# Patient Record
Sex: Female | Born: 1985 | Race: Black or African American | Hispanic: No | Marital: Single | Smoking: Current some day smoker
Health system: Southern US, Community
[De-identification: ages and names within clinical notes are randomized; demographics above are authoritative.]

## PROBLEM LIST (undated history)

## (undated) DIAGNOSIS — E785 Hyperlipidemia, unspecified: Secondary | ICD-10-CM

## (undated) HISTORY — PX: FOOT FRACTURE SURGERY: SHX645

## (undated) HISTORY — DX: Hyperlipidemia, unspecified: E78.5

---

## 2018-03-06 ENCOUNTER — Encounter (HOSPITAL_COMMUNITY): Payer: Self-pay | Admitting: Emergency Medicine

## 2018-03-06 ENCOUNTER — Emergency Department (HOSPITAL_COMMUNITY)
Admission: EM | Admit: 2018-03-06 | Discharge: 2018-03-07 | Disposition: A | Discharge status: Home / Self Care | Payer: 59 | Attending: Emergency Medicine | Admitting: Emergency Medicine

## 2018-03-06 ENCOUNTER — Emergency Department (HOSPITAL_COMMUNITY): Payer: 59

## 2018-03-06 ENCOUNTER — Other Ambulatory Visit: Payer: Self-pay

## 2018-03-06 DIAGNOSIS — S00212A Abrasion of left eyelid and periocular area, initial encounter: Secondary | ICD-10-CM | POA: Diagnosis not present

## 2018-03-06 DIAGNOSIS — Y999 Unspecified external cause status: Secondary | ICD-10-CM | POA: Diagnosis not present

## 2018-03-06 DIAGNOSIS — Z23 Encounter for immunization: Secondary | ICD-10-CM | POA: Diagnosis not present

## 2018-03-06 DIAGNOSIS — S80211A Abrasion, right knee, initial encounter: Secondary | ICD-10-CM | POA: Diagnosis not present

## 2018-03-06 DIAGNOSIS — Y929 Unspecified place or not applicable: Secondary | ICD-10-CM | POA: Diagnosis not present

## 2018-03-06 DIAGNOSIS — M542 Cervicalgia: Secondary | ICD-10-CM | POA: Diagnosis present

## 2018-03-06 DIAGNOSIS — M2550 Pain in unspecified joint: Secondary | ICD-10-CM

## 2018-03-06 DIAGNOSIS — Y939 Activity, unspecified: Secondary | ICD-10-CM | POA: Insufficient documentation

## 2018-03-06 LAB — POC URINE PREG, ED: Preg Test, Ur: NEGATIVE

## 2018-03-06 MED ORDER — ACETAMINOPHEN 500 MG PO TABS: 500.0000 mg | ORAL_TABLET | Freq: Four times a day (QID) | ORAL | 0 refills | Status: DC | PRN

## 2018-03-06 MED ORDER — ACETAMINOPHEN 500 MG PO TABS
1000.0000 mg | ORAL_TABLET | Freq: Once | ORAL | Status: AC
  Administered 2018-03-06: 1000 mg via ORAL
  Filled 2018-03-06: qty 2

## 2018-03-06 MED ORDER — CYCLOBENZAPRINE HCL 10 MG PO TABS: 10.0000 mg | ORAL_TABLET | Freq: Two times a day (BID) | ORAL | 0 refills | Status: DC | PRN

## 2018-03-06 MED ORDER — CYCLOBENZAPRINE HCL 10 MG PO TABS
10.0000 mg | ORAL_TABLET | Freq: Once | ORAL | Status: AC
  Administered 2018-03-06: 10 mg via ORAL
  Filled 2018-03-06: qty 1

## 2018-03-06 MED ORDER — TETANUS-DIPHTH-ACELL PERTUSSIS 5-2.5-18.5 LF-MCG/0.5 IM SUSP
0.5000 mL | Freq: Once | INTRAMUSCULAR | Status: AC
  Administered 2018-03-06: 0.5 mL via INTRAMUSCULAR
  Filled 2018-03-06: qty 0.5

## 2018-03-06 MED ORDER — IBUPROFEN 600 MG PO TABS: 600.0000 mg | ORAL_TABLET | Freq: Four times a day (QID) | ORAL | 0 refills | Status: DC | PRN
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
## 2018-03-06 NOTE — Discharge Instructions (Signed)
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
## 2018-03-06 NOTE — Progress Notes (Signed)
Orthopedic Tech Progress Note Patient Details:  Samantha Haney [birth year] [id number] Pt was given sleeve to go home to put on because she has a pants on before I got there. Ortho Devices Type of Ortho Device: Crutches, Knee Sleeve Ortho Device/Splint Location: Rt knee Ortho Device/Splint Interventions: Adjustment   Post Interventions Patient Tolerated: Well Instructions Provided: Adjustment of device, Care of device   Tawni Carnes [facility] 03/06/2018, 11:45 PM
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
## 2018-03-07 NOTE — ED Notes (Signed)
Patient verbalizes understanding of discharge instructions. Opportunity for questioning and answers were provided. Armband removed by staff, pt discharged from ED home via POV with family. 

## 2018-03-13 ENCOUNTER — Ambulatory Visit (INDEPENDENT_AMBULATORY_CARE_PROVIDER_SITE_OTHER): Payer: 59 | Admitting: Sports Medicine

## 2018-03-13 VITALS — BP 104/76 | Ht 66.0 in | Wt 130.0 lb

## 2018-03-13 DIAGNOSIS — M7582 Other shoulder lesions, left shoulder: Secondary | ICD-10-CM

## 2018-03-13 DIAGNOSIS — S8001XA Contusion of right knee, initial encounter: Secondary | ICD-10-CM

## 2018-03-13 MED ORDER — METHYLPREDNISOLONE ACETATE 40 MG/ML IJ SUSP
40.0000 mg | Freq: Once | INTRAMUSCULAR | Status: AC
  Administered 2018-03-13: 40 mg via INTRA_ARTICULAR

## 2018-03-13 MED ORDER — MELOXICAM 15 MG PO TABS: 15.0000 mg | ORAL_TABLET | Freq: Every day | ORAL | 1 refills | Status: DC

## 2018-03-13 NOTE — Patient Instructions (Signed)
Thank you for coming see [place] today in clinic.  You were seen today for your left shoulder and right knee.  We gave you a shot in the shoulder, which should hopefully help with the pain.  For your knee, we will give you an anti-inflammatory medication.  We will see back in 2 weeks to reevaluate.
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
## 2018-03-27 ENCOUNTER — Ambulatory Visit (INDEPENDENT_AMBULATORY_CARE_PROVIDER_SITE_OTHER): Payer: 59 | Admitting: Sports Medicine

## 2018-03-27 ENCOUNTER — Encounter: Payer: Self-pay | Admitting: Sports Medicine

## 2018-03-27 VITALS — BP 139/97 | Ht 66.0 in | Wt 180.0 lb

## 2018-03-27 DIAGNOSIS — S8002XD Contusion of left knee, subsequent encounter: Secondary | ICD-10-CM | POA: Diagnosis not present

## 2018-03-27 MED ORDER — TRAMADOL HCL 50 MG PO TABS: 50.0000 mg | ORAL_TABLET | Freq: Four times a day (QID) | ORAL | 0 refills | Status: DC | PRN
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
## 2018-03-27 NOTE — Progress Notes (Signed)
Subjective       Objective             Assessment/Plan

## 2018-04-10 ENCOUNTER — Ambulatory Visit (INDEPENDENT_AMBULATORY_CARE_PROVIDER_SITE_OTHER): Payer: 59 | Admitting: Sports Medicine

## 2018-04-10 VITALS — BP 118/84 | Ht 66.0 in | Wt 180.0 lb

## 2018-04-10 DIAGNOSIS — S8001XD Contusion of right knee, subsequent encounter: Secondary | ICD-10-CM
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
## 2018-04-24 ENCOUNTER — Ambulatory Visit (INDEPENDENT_AMBULATORY_CARE_PROVIDER_SITE_OTHER): Payer: 59 | Admitting: Sports Medicine

## 2018-04-24 VITALS — BP 112/88 | Ht 66.0 in | Wt 180.0 lb

## 2018-04-24 DIAGNOSIS — S8001XD Contusion of right knee, subsequent encounter: Secondary | ICD-10-CM
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
## 2018-04-26 ENCOUNTER — Other Ambulatory Visit: Payer: Self-pay | Admitting: Gastroenterology

## 2018-04-26 DIAGNOSIS — R112 Nausea with vomiting, unspecified: Secondary | ICD-10-CM

## 2018-04-26 DIAGNOSIS — R1013 Epigastric pain: Secondary | ICD-10-CM

## 2018-05-02 ENCOUNTER — Telehealth: Payer: Self-pay | Admitting: *Deleted

## 2018-05-02 NOTE — Telephone Encounter (Signed)
Letter written for patient to pick up.

## 2018-05-04 ENCOUNTER — Ambulatory Visit
Admission: RE | Admit: 2018-05-04 | Discharge: 2018-05-04 | Disposition: A | Discharge status: Home / Self Care | Payer: 59 | Source: Ambulatory Visit | Attending: Sports Medicine | Admitting: Sports Medicine

## 2018-05-04 DIAGNOSIS — S8001XD Contusion of right knee, subsequent encounter: Secondary | ICD-10-CM

## 2018-05-06 ENCOUNTER — Ambulatory Visit
Admission: RE | Admit: 2018-05-06 | Discharge: 2018-05-06 | Disposition: A | Discharge status: Home / Self Care | Payer: 59 | Source: Ambulatory Visit | Attending: Gastroenterology | Admitting: Gastroenterology

## 2018-05-06 DIAGNOSIS — R1013 Epigastric pain: Secondary | ICD-10-CM

## 2018-05-06 DIAGNOSIS — R112 Nausea with vomiting, unspecified: Secondary | ICD-10-CM

## 2018-05-07 ENCOUNTER — Telehealth: Payer: Self-pay

## 2018-05-07 NOTE — Telephone Encounter (Signed)
Note written and faxed to pt's work.

## 2018-05-14 ENCOUNTER — Encounter (HOSPITAL_COMMUNITY): Payer: Self-pay | Admitting: Psychiatry

## 2018-05-14 ENCOUNTER — Ambulatory Visit (INDEPENDENT_AMBULATORY_CARE_PROVIDER_SITE_OTHER): Payer: 59 | Admitting: Psychiatry

## 2018-05-14 ENCOUNTER — Other Ambulatory Visit: Payer: Self-pay

## 2018-05-14 VITALS — BP 138/80 | HR 65 | Temp 98.3°F | Ht 66.0 in | Wt 175.0 lb

## 2018-05-14 DIAGNOSIS — F411 Generalized anxiety disorder: Secondary | ICD-10-CM | POA: Diagnosis not present

## 2018-05-14 DIAGNOSIS — F4310 Post-traumatic stress disorder, unspecified: Secondary | ICD-10-CM

## 2018-05-14 DIAGNOSIS — M25569 Pain in unspecified knee: Secondary | ICD-10-CM | POA: Insufficient documentation

## 2018-05-14 MED ORDER — TRAZODONE HCL 50 MG PO TABS: ORAL_TABLET | ORAL | 0 refills | Status: DC

## 2018-05-14 MED ORDER — SERTRALINE HCL 50 MG PO TABS: ORAL_TABLET | ORAL | 1 refills | Status: DC
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
## 2018-05-15 ENCOUNTER — Ambulatory Visit (INDEPENDENT_AMBULATORY_CARE_PROVIDER_SITE_OTHER): Payer: 59 | Admitting: Sports Medicine

## 2018-05-15 ENCOUNTER — Other Ambulatory Visit: Payer: Self-pay

## 2018-05-15 VITALS — BP 112/82 | Temp 98.3°F | Ht 66.0 in | Wt 173.0 lb

## 2018-05-15 DIAGNOSIS — S8001XD Contusion of right knee, subsequent encounter: Secondary | ICD-10-CM | POA: Diagnosis not present

## 2018-05-15 DIAGNOSIS — S46012D Strain of muscle(s) and tendon(s) of the rotator cuff of left shoulder, subsequent encounter: Secondary | ICD-10-CM

## 2018-05-15 DIAGNOSIS — S8011XD Contusion of right lower leg, subsequent encounter: Secondary | ICD-10-CM

## 2018-05-15 MED ORDER — MELOXICAM 15 MG PO TABS: ORAL_TABLET | ORAL | 0 refills | Status: DC
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
## 2018-05-22 ENCOUNTER — Ambulatory Visit: Payer: 59 | Admitting: Sports Medicine

## 2018-06-11 ENCOUNTER — Encounter: Payer: Self-pay | Admitting: Family Medicine

## 2018-06-11 ENCOUNTER — Other Ambulatory Visit: Payer: Self-pay

## 2018-06-11 ENCOUNTER — Ambulatory Visit (INDEPENDENT_AMBULATORY_CARE_PROVIDER_SITE_OTHER): Payer: 59 | Admitting: Family Medicine

## 2018-06-11 VITALS — BP 118/81 | HR 60 | Temp 98.3°F | Ht 66.0 in | Wt 180.0 lb

## 2018-06-11 DIAGNOSIS — M67912 Unspecified disorder of synovium and tendon, left shoulder: Secondary | ICD-10-CM

## 2018-06-11 NOTE — Patient Instructions (Signed)
We repeated the injection for your shoulder today. Wait 5-7 days then restart home exercises - these are very important to prevent this from returning. Meloxicam 15mg  daily with food for pain and inflammation. Icing as needed. Follow up with [place] in 6 weeks for reevaluation. Will consider repeating your ultrasound, physical therapy if not improving as expected.
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
## 2018-06-12 ENCOUNTER — Encounter: Payer: Self-pay | Admitting: Family Medicine

## 2018-06-12 DIAGNOSIS — M67912 Unspecified disorder of synovium and tendon, left shoulder: Secondary | ICD-10-CM | POA: Diagnosis not present

## 2018-06-12 MED ORDER — METHYLPREDNISOLONE ACETATE 40 MG/ML IJ SUSP
40.0000 mg | Freq: Once | INTRAMUSCULAR | Status: AC
  Administered 2018-06-12: 40 mg

## 2018-06-14 ENCOUNTER — Ambulatory Visit (INDEPENDENT_AMBULATORY_CARE_PROVIDER_SITE_OTHER): Payer: 59 | Admitting: Psychiatry

## 2018-06-14 ENCOUNTER — Other Ambulatory Visit: Payer: Self-pay

## 2018-06-14 DIAGNOSIS — Z79899 Other long term (current) drug therapy: Secondary | ICD-10-CM

## 2018-06-14 DIAGNOSIS — F4310 Post-traumatic stress disorder, unspecified: Secondary | ICD-10-CM

## 2018-06-14 DIAGNOSIS — F411 Generalized anxiety disorder: Secondary | ICD-10-CM | POA: Diagnosis not present

## 2018-06-14 MED ORDER — SERTRALINE HCL 50 MG PO TABS: 75.0000 mg | ORAL_TABLET | Freq: Every day | ORAL | 1 refills | Status: DC
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
## 2018-06-18 ENCOUNTER — Ambulatory Visit: Payer: Managed Care, Other (non HMO) | Admitting: Family Medicine

## 2018-07-23 ENCOUNTER — Ambulatory Visit: Payer: Managed Care, Other (non HMO) | Admitting: Family Medicine

## 2018-07-30 ENCOUNTER — Ambulatory Visit: Payer: Managed Care, Other (non HMO) | Admitting: Family Medicine

## 2018-08-07 ENCOUNTER — Other Ambulatory Visit: Payer: Self-pay

## 2018-08-07 ENCOUNTER — Ambulatory Visit (INDEPENDENT_AMBULATORY_CARE_PROVIDER_SITE_OTHER): Payer: 59 | Admitting: Family Medicine

## 2018-08-07 VITALS — BP 110/80 | Ht 66.0 in | Wt 175.0 lb

## 2018-08-07 DIAGNOSIS — M25512 Pain in left shoulder: Secondary | ICD-10-CM

## 2018-08-07 DIAGNOSIS — M545 Low back pain: Secondary | ICD-10-CM

## 2018-08-07 DIAGNOSIS — M5459 Other low back pain: Secondary | ICD-10-CM

## 2018-08-07 NOTE — Patient Instructions (Signed)
We will go ahead with an MRI of your left shoulder since you're not improving as expected.  You have a lumbar strain. Ok to take tylenol for baseline pain relief (1-2 extra strength tabs 3x/day) Aleve or ibuprofen with food for pain and inflammation as needed. Stay as active as possible. Do home exercises and stretches as directed - hold each for 20-30 seconds and do each one three times. Consider massage, chiropractor, physical therapy, and/or acupuncture. Physical therapy has been shown to be helpful while the others have mixed results - start this. Strengthening of low back muscles, abdominal musculature are key for long term pain relief. If not improving, will consider further imaging (MRI). Follow up with me as needed.

## 2018-08-13 ENCOUNTER — Encounter: Payer: Self-pay | Admitting: Family Medicine
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
## 2018-08-14 ENCOUNTER — Ambulatory Visit (INDEPENDENT_AMBULATORY_CARE_PROVIDER_SITE_OTHER): Payer: 59 | Admitting: Psychiatry

## 2018-08-14 ENCOUNTER — Other Ambulatory Visit: Payer: Self-pay

## 2018-08-14 ENCOUNTER — Encounter (HOSPITAL_COMMUNITY): Payer: Self-pay | Admitting: Psychiatry

## 2018-08-14 DIAGNOSIS — F1010 Alcohol abuse, uncomplicated: Secondary | ICD-10-CM

## 2018-08-14 DIAGNOSIS — F4310 Post-traumatic stress disorder, unspecified: Secondary | ICD-10-CM

## 2018-08-14 DIAGNOSIS — F411 Generalized anxiety disorder: Secondary | ICD-10-CM

## 2018-08-14 MED ORDER — SERTRALINE HCL 100 MG PO TABS: 100.0000 mg | ORAL_TABLET | Freq: Every day | ORAL | 1 refills | Status: DC
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
## 2018-08-20 ENCOUNTER — Ambulatory Visit: Payer: 59 | Admitting: Physical Therapy

## 2018-08-26 ENCOUNTER — Ambulatory Visit: Payer: 59 | Attending: Family Medicine | Admitting: Physical Therapy

## 2018-09-04 ENCOUNTER — Telehealth: Payer: Self-pay | Admitting: General Practice

## 2018-09-04 NOTE — Telephone Encounter (Signed)
Patient is calling to find out where she needs to go for her MRI of L. Shoulder.  Please let her know, thanks.

## 2018-09-09 ENCOUNTER — Other Ambulatory Visit: Payer: Self-pay

## 2018-09-11 ENCOUNTER — Other Ambulatory Visit: Payer: Self-pay

## 2018-09-11 ENCOUNTER — Encounter: Payer: Self-pay | Admitting: Physical Therapy

## 2018-09-11 ENCOUNTER — Ambulatory Visit: Payer: 59 | Attending: Family Medicine | Admitting: Physical Therapy

## 2018-09-11 DIAGNOSIS — G8929 Other chronic pain: Secondary | ICD-10-CM | POA: Diagnosis present

## 2018-09-11 DIAGNOSIS — M545 Low back pain: Secondary | ICD-10-CM | POA: Diagnosis present
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
## 2018-09-19 ENCOUNTER — Ambulatory Visit: Payer: 59 | Admitting: Physical Therapy

## 2018-09-20 ENCOUNTER — Encounter

## 2018-09-24 ENCOUNTER — Ambulatory Visit: Payer: 59 | Admitting: Physical Therapy

## 2018-09-24 ENCOUNTER — Other Ambulatory Visit: Payer: Self-pay

## 2018-09-24 ENCOUNTER — Encounter: Payer: Self-pay | Admitting: Physical Therapy

## 2018-09-24 DIAGNOSIS — M545 Low back pain, unspecified: Secondary | ICD-10-CM

## 2018-09-24 DIAGNOSIS — G8929 Other chronic pain: Secondary | ICD-10-CM
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
## 2018-09-25 ENCOUNTER — Ambulatory Visit (HOSPITAL_COMMUNITY): Payer: 59 | Admitting: Psychiatry

## 2018-09-26 ENCOUNTER — Ambulatory Visit: Payer: 59 | Admitting: Physical Therapy

## 2018-09-26 ENCOUNTER — Encounter: Payer: Self-pay | Admitting: Physical Therapy

## 2018-09-26 ENCOUNTER — Other Ambulatory Visit: Payer: Self-pay

## 2018-09-26 DIAGNOSIS — M545 Low back pain: Secondary | ICD-10-CM | POA: Diagnosis not present

## 2018-09-26 DIAGNOSIS — G8929 Other chronic pain: Secondary | ICD-10-CM

## 2018-09-26 NOTE — Therapy (Signed)
[place], [place], [address] Phone: [phone]   Fax:  [phone]  Physical Therapy Treatment  Patient Details  Name: Samantha Haney MRN: [id number] Date of Birth: 11-22-1985 Referring Provider (PT): Dene Gentry, MD   Encounter Date: 09/26/2018  PT End of Session - 09/26/18 1615    Visit Number  3    Number of Visits  13    Date for PT Re-Evaluation  10/25/18    Authorization Type  UHC/CIGNA    PT Start Time  4536    PT Stop Time  1638    PT Time Calculation (min)  23 min    Activity Tolerance  Patient tolerated treatment well    Behavior During Therapy  [facility] for tasks assessed/performed       History reviewed. No pertinent past medical history.  Past Surgical History:  Procedure Laterality Date  . FOOT FRACTURE SURGERY Bilateral     There were no vitals filed for this visit.  Subjective Assessment - 09/26/18 1616    Subjective  It has been a long day. My back is just sore- put icy hot on it which helped a lot. Feels like it is more on the right side. I go off of work at 5:30AM and have been in the car all day.    Patient Stated Goals  bend over, put pressure on Rt leg. used to be able to bend to touch floor, stoop, play with kids                       [facility] Adult PT Treatment/Exercise - 09/26/18 0001      Knee/Hip Exercises: Stretches   Passive Hamstring Stretch  Both;2 reps;30 seconds    Piriformis Stretch Limitations  figure 4    Other Knee/Hip Stretches  sidelying QL stretch      Knee/Hip Exercises: Aerobic   Nustep  UE & LE 3 min L5      Knee/Hip Exercises: Supine   Bridges  15 reps    Other Supine Knee/Hip Exercises  hip abduction leg hovered over the table      Knee/Hip Exercises: Sidelying   Clams  x20 each      Manual Therapy   Manual Therapy  Soft tissue mobilization    Soft tissue mobilization  Rt QL               PT Short Term Goals -  09/11/18 1548      PT SHORT TERM GOAL #1   Title  STG=LTG        PT Long Term Goals - 09/11/18 1548      PT LONG TERM GOAL #1   Title  gross LE strength 5/5    Baseline  gross 4/5 at eval with discomfort upon testing    Time  6    Period  Weeks    Status  New    Target Date  10/25/18      PT LONG TERM GOAL #2   Title  Pt will be able to tolerate sitting at computer for 45 min comfortably for work and be compliant with a regular movement routine    Baseline  unable to sit comfortably at eval    Time  6    Period  Weeks    Status  New    Target Date  10/25/18      PT LONG TERM GOAL #3   Title  Pt will be able to bend and stoop to play with kids, LBP <=3/10    Baseline  severe pain at eval, avoids a lot of these positions due to pain    Time  6    Period  Weeks    Status  New    Target Date  10/25/18      PT LONG TERM GOAL #4   Title  Pt will be able to perform lifting activities required for work with minimal to no pain    Baseline  severe at eval    Time  6    Period  Weeks    Status  New    Target Date  10/25/18            Plan - 09/26/18 1638    Clinical Impression Statement  Pt was exhausted after working last night and being up all day driving back and forth to [place] and had her Mom and daughter in the car so we shortened her visit today. Added hip abduction strengthening to HEP. Improved ability to engage core.    PT Treatment/Interventions  ADLs/Self Care Home Management;Cryotherapy;Electrical Stimulation;Ultrasound;Traction;Moist Heat;Iontophoresis 53m/ml Dexamethasone;Functional mobility training;Therapeutic activities;Therapeutic exercise;Neuromuscular re-education;Manual techniques;Patient/family education;Passive range of motion;Dry needling;Taping    PT Next Visit Plan  review HEP    PT Home Exercise Plan  TrA engagement, MET PRN, iso LE adduction, LTR, bicycle, hamstring stretch, piriformis stretch; clam, supine hip abd leg hovering    Consulted and  Agree with Plan of Care  Patient       Patient will benefit from skilled therapeutic intervention in order to improve the following deficits and impairments:  Decreased range of motion, Increased muscle spasms, Decreased activity tolerance, Pain, Improper body mechanics, Impaired flexibility, Decreased strength, Postural dysfunction  Visit Diagnosis: 1. Chronic bilateral low back pain without sciatica        Problem List Patient Active Problem List   Diagnosis Date Noted  . Knee pain 05/14/2018    Kareemah C. Claretta Kendra PT, DPT 09/26/18 4:40 PM   [facility]GP[address] [place] [address]Phone: [phone]  Fax:  [phone] Name: JShandria ClinchMRN: [id number] of Birth: 425-Sep-1987

## 2018-10-01 ENCOUNTER — Ambulatory Visit: Payer: 59 | Attending: Family Medicine | Admitting: Physical Therapy

## 2018-10-01 ENCOUNTER — Encounter: Payer: Self-pay | Admitting: Physical Therapy

## 2018-10-01 ENCOUNTER — Other Ambulatory Visit: Payer: Self-pay

## 2018-10-01 DIAGNOSIS — G8929 Other chronic pain: Secondary | ICD-10-CM | POA: Insufficient documentation

## 2018-10-01 DIAGNOSIS — M545 Low back pain, unspecified: Secondary | ICD-10-CM

## 2018-10-01 NOTE — Therapy (Signed)
[place], [place], [address] Phone: [phone]   Fax:  [phone]  Physical Therapy Treatment/Discharge  Patient Details  Name: Samantha Haney MRN: [id number] Date of Birth: 1986/01/09 Referring Provider (PT): Dene Gentry, MD   Encounter Date: 10/01/2018  PT End of Session - 10/01/18 1509    Visit Number  4    Number of Visits  13    Date for PT Re-Evaluation  10/25/18    Authorization Type  UHC/CIGNA    PT Start Time  6203    PT Stop Time  1518    PT Time Calculation (min)  12 min    Activity Tolerance  Patient tolerated treatment well    Behavior During Therapy  [facility] for tasks assessed/performed       History reviewed. No pertinent past medical history.  Past Surgical History:  Procedure Laterality Date  . FOOT FRACTURE SURGERY Bilateral     There were no vitals filed for this visit.  Subjective Assessment - 10/01/18 1509    Subjective  started a boxing class so I am sore.    Patient Stated Goals  bend over, put pressure on Rt leg. used to be able to bend to touch floor, stoop, play with kids    Currently in Pain?  No/denies         [facility] PT Assessment - 10/01/18 0001      Assessment   Medical Diagnosis  mechanical LBP    Referring Provider (PT)  Barbaraann Barthel, Sharyn Lull, MD      Strength   Overall Strength Comments  gross 5/5 no pain                           PT Education - 10/01/18 1535    Education Details  importance of continued exercise & HEP    Person(s) Educated  Patient    Methods  Explanation    Comprehension  Verbalized understanding       PT Short Term Goals - 09/11/18 1548      PT SHORT TERM GOAL #1   Title  STG=LTG        PT Long Term Goals - 10/01/18 1512      PT LONG TERM GOAL #1   Title  gross LE strength 5/5    Status  Achieved      PT LONG TERM GOAL #2   Title  Pt will be able to tolerate sitting at computer for 45 min  comfortably for work and be compliant with a regular movement routine    Status  Achieved      PT LONG TERM GOAL #3   Title  Pt will be able to bend and stoop to play with kids, LBP <=3/10    Status  Achieved      PT LONG TERM GOAL #4   Title  Pt will be able to perform lifting activities required for work with minimal to no pain    Status  Achieved            Plan - 10/01/18 1527    Clinical Impression Statement  Pt has met all of her goals at this time and feels really good. She has started a boxing class for exercise and reports understanding and use of her HEP. encouraged her to contact us with any further questions.    PT Treatment/Interventions  ADLs/Self Care Home Management;Cryotherapy;Electrical Stimulation;Ultrasound;Traction;Moist Heat;Iontophoresis 51m/ml Dexamethasone;Functional  mobility training;Therapeutic activities;Therapeutic exercise;Neuromuscular re-education;Manual techniques;Patient/family education;Passive range of motion;Dry needling;Taping    PT Home Exercise Plan  TrA engagement, MET PRN, iso LE adduction, LTR, bicycle, hamstring stretch, piriformis stretch; clam, supine hip abd leg hovering    Consulted and Agree with Plan of Care  Patient       Patient will benefit from skilled therapeutic intervention in order to improve the following deficits and impairments:  Decreased range of motion, Increased muscle spasms, Decreased activity tolerance, Pain, Improper body mechanics, Impaired flexibility, Decreased strength, Postural dysfunction  Visit Diagnosis: 1. Chronic bilateral low back pain without sciatica        Problem List Patient Active Problem List   Diagnosis Date Noted  . Knee pain 05/14/2018   PHYSICAL THERAPY DISCHARGE SUMMARY  Visits from Start of Care: 4   Current functional level related to goals / functional outcomes: See above   Remaining deficits: See above   Education / Equipment: Anatomy of condition, POC, HEP, exercise  form/rationale  Plan: Patient agrees to discharge.  Patient goals were met. Patient is being discharged due to meeting the stated rehab goals.  ?????     Janavia C. Alyssha Housh PT, DPT 10/01/18 3:36 PM   [facility] [address] [place], [place], [address] Phone: [phone]   Fax:  [phone]  Name: Samantha Haney MRN: [id number] Date of Birth: October 27, 1985

## 2018-10-03 ENCOUNTER — Ambulatory Visit: Payer: 59 | Admitting: Physical Therapy

## 2018-10-08 ENCOUNTER — Ambulatory Visit: Payer: 59 | Admitting: Physical Therapy

## 2018-10-10 ENCOUNTER — Encounter: Payer: Self-pay | Admitting: Physical Therapy

## 2018-10-15 ENCOUNTER — Encounter: Payer: Self-pay | Admitting: Physical Therapy

## 2018-10-17 ENCOUNTER — Encounter: Payer: Self-pay | Admitting: Physical Therapy

## 2018-10-22 ENCOUNTER — Encounter: Payer: Self-pay | Admitting: Physical Therapy

## 2018-10-23 ENCOUNTER — Telehealth (HOSPITAL_COMMUNITY): Payer: Self-pay

## 2018-10-23 NOTE — Telephone Encounter (Signed)
error 

## 2018-10-24 ENCOUNTER — Encounter: Payer: Self-pay | Admitting: Physical Therapy

## 2018-12-23 ENCOUNTER — Ambulatory Visit (INDEPENDENT_AMBULATORY_CARE_PROVIDER_SITE_OTHER): Payer: 59 | Admitting: Family Medicine

## 2018-12-23 ENCOUNTER — Other Ambulatory Visit: Payer: Self-pay

## 2018-12-23 VITALS — BP 110/84 | Ht 66.0 in | Wt 180.0 lb

## 2018-12-23 DIAGNOSIS — S8011XD Contusion of right lower leg, subsequent encounter: Secondary | ICD-10-CM

## 2018-12-23 DIAGNOSIS — S8001XD Contusion of right knee, subsequent encounter: Secondary | ICD-10-CM | POA: Diagnosis not present

## 2018-12-23 DIAGNOSIS — S8011XA Contusion of right lower leg, initial encounter: Secondary | ICD-10-CM | POA: Insufficient documentation

## 2018-12-23 DIAGNOSIS — M545 Low back pain: Secondary | ICD-10-CM | POA: Insufficient documentation

## 2018-12-23 DIAGNOSIS — M5459 Other low back pain: Secondary | ICD-10-CM

## 2018-12-23 DIAGNOSIS — S8001XA Contusion of right knee, initial encounter: Secondary | ICD-10-CM | POA: Insufficient documentation

## 2018-12-23 MED ORDER — DICLOFENAC SODIUM 75 MG PO TBEC: 75.0000 mg | DELAYED_RELEASE_TABLET | Freq: Two times a day (BID) | ORAL | 1 refills | Status: AC

## 2018-12-23 NOTE — Assessment & Plan Note (Signed)
Knee exam remains within normal limits aside from tenderness palpation of anteriomedial portion of knee.  MRI of knee from March 2020, again reviewed.  Swelling seen on MRI corresponds to location of pain.  She did not have improvement with physical therapy per her report.  Structurally, knee exam is normal.  Advised patient there is nothing else to offer for this as there is no indication for surgery.  She declines physical therapy.

## 2018-12-23 NOTE — Patient Instructions (Signed)
We will go ahead with an MRI of your lumbar spine given your pain has persisted in your low back. Take diclofenac 75mg  twice a day with food for pain and inflammation. Ok to take tylenol, use topical medication with this. Ice (or heat if this feels better) 15 minutes at a time 3-4 times a day. Follow up with me after the MRI for a no charge visit to go over results and next steps.
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
## 2018-12-24 ENCOUNTER — Encounter: Payer: Self-pay | Admitting: Family Medicine

## 2018-12-26 ENCOUNTER — Ambulatory Visit: Payer: Managed Care, Other (non HMO) | Admitting: Cardiology

## 2018-12-26 DIAGNOSIS — E785 Hyperlipidemia, unspecified: Secondary | ICD-10-CM | POA: Insufficient documentation

## 2018-12-26 NOTE — Progress Notes (Deleted)
Patient referred by Servando Salina, MD for dyslipidemia  Subjective:   Samantha Haney, female    DOB: 27-Aug-1985, 32 y.o.   MRN: [id number]  *** No chief complaint on file.   *** HPI  33 y.o. *** female referred for evaluation and management of dyslipidemia.  Patient recently underwent lipid panel through Dr. Harvie Bridge office that showed triglycerides elevated at 268, LDL at 114.***  No past medical history on file.  *** Past Surgical History:  Procedure Laterality Date  . FOOT FRACTURE SURGERY Bilateral     *** Social History   Socioeconomic History  . Marital status: Single    Spouse name: Not on file  . Number of children: Not on file  . Years of education: Not on file  . Highest education level: Not on file  Occupational History  . Not on file  Social Needs  . Financial resource strain: Not on file  . Food insecurity    Worry: Not on file    Inability: Not on file  . Transportation needs    Medical: Not on file    Non-medical: Not on file  Tobacco Use  . Smoking status: Current Some Day Smoker    Types: Cigars  . Smokeless tobacco: Never Used  . Tobacco comment: black and milds  Substance and Sexual Activity  . Alcohol use: Yes    Comment: at night to help sleep  . Drug use: Never  . Sexual activity: Yes    Birth control/protection: Condom  Lifestyle  . Physical activity    Days per week: Not on file    Minutes per session: Not on file  . Stress: Not on file  Relationships  . Social Herbalist on phone: Not on file    Gets together: Not on file    Attends religious service: Not on file    Active member of club or organization: Not on file    Attends meetings of clubs or organizations: Not on file    Relationship status: Not on file  . Intimate partner violence    Fear of current or ex partner: Not on file    Emotionally abused: Not on file    Physically abused: Not on file    Forced sexual activity: Not on file  Other  Topics Concern  . Not on file  Social History Narrative  . Not on file    *** No family history on file.  *** Current Outpatient Medications on File Prior to Visit  Medication Sig Dispense Refill  . diclofenac (VOLTAREN) 75 MG EC tablet Take 1 tablet (75 mg total) by mouth 2 (two) times daily. 60 tablet 1  . meloxicam (MOBIC) 15 MG tablet Take 1 tablet daily with food for 7 days. Then take as needed. (Patient not taking: Reported on 09/11/2018) 40 tablet 0  . Multiple Vitamins-Minerals (EMERGEN-C IMMUNE PLUS/VIT D) CHEW Chew by mouth.    . sertraline (ZOLOFT) 100 MG tablet Take 1 tablet (100 mg total) by mouth daily. (Patient not taking: Reported on 09/11/2018) 30 tablet 1  . traMADol (ULTRAM) 50 MG tablet Take by mouth every 6 (six) hours as needed.    . traZODone (DESYREL) 50 MG tablet Take 1/2 to one tab at bed time for insomnia (Patient not taking: Reported on 08/14/2018) 30 tablet 0   No current facility-administered medications on file prior to visit.     Cardiovascular studies:  ***  *** Recent labs: 11/22/2018: Glucose 81.  BUN/creatinine/0.77.  eGFR normal.  Sodium 141, potassium 4.4.  Rest of the CMP normal. H/H 12/36.  MCV 93.  Platelets 349. Cholesterol 250, triglycerides 268, HDL 55, LDL 114. Hemoglobin A1c 5.4%.  ***  ROS      *** There were no vitals filed for this visit.   There is no height or weight on file to calculate BMI. There were no vitals filed for this visit.  *** Objective:   Physical Exam        Assessment & Recommendations:   ***  ***   Thank you for referring the patient to [place]. Please feel free to contact with any questions.  Nigel Mormon, MD [facility] Pager: [phone] Office: [phone] If no answer Cell [phone]

## 2019-01-01 ENCOUNTER — Other Ambulatory Visit: Payer: Managed Care, Other (non HMO)

## 2019-01-08 NOTE — Progress Notes (Signed)
Patient referred by Servando Salina, MD for dyslipidemia  Subjective:   Samantha Haney, female    DOB: 1986-02-19, 33 y.o.   MRN: [id number]   Chief Complaint  Patient presents with   Hyperlipidemia   New Patient (Initial Visit)    HPI  33 y.o. African-American female referred for evaluation and management of dyslipidemia.  Patient recently underwent lipid panel through Dr. Harvie Bridge office that showed triglycerides elevated at 268, LDL at 114.  Patient is unclear about recent blood work today, but understands when to explain to her.  She is single mother, lives with her 2 children.  She works from home for [employer].  She used to exercise regularly until few months ago.  Recently, work has meant that she does not do any regular physical activity.  She denies any chest pain, shortness of breath.  She does not have a known family history of coronary artery disease or hyperlipidemia.  She smokes 1 cigar/day for last 20 years.  She is willing to quit.  She endorses snoring at night.  Blood pressure mildly elevated today.  Patient has 2 children and does not have any plans for future pregnancies at this time.  That said, she does not use any regular contraception, but is not sexually active.   Past Medical History:  Diagnosis Date   Hyperlipidemia      Past Surgical History:  Procedure Laterality Date   FOOT FRACTURE SURGERY Bilateral      Social History   Socioeconomic History   Marital status: Single    Spouse name: Not on file   Number of children: 2   Years of education: Not on file   Highest education level: Not on file  Occupational History   Not on file  Social Needs   Financial resource strain: Not on file   Food insecurity    Worry: Not on file    Inability: Not on file   Transportation needs    Medical: Not on file    Non-medical: Not on file  Tobacco Use   Smoking status: Current Some Day Smoker    Types: Cigars    Smokeless tobacco: Never Used   Tobacco comment: black and milds 1 per day  Substance and Sexual Activity   Alcohol use: Yes    Comment: at night to help sleep   Drug use: Never   Sexual activity: Yes    Birth control/protection: Condom  Lifestyle   Physical activity    Days per week: Not on file    Minutes per session: Not on file   Stress: Not on file  Relationships   Social connections    Talks on phone: Not on file    Gets together: Not on file    Attends religious service: Not on file    Active member of club or organization: Not on file    Attends meetings of clubs or organizations: Not on file    Relationship status: Not on file   Intimate partner violence    Fear of current or ex partner: Not on file    Emotionally abused: Not on file    Physically abused: Not on file    Forced sexual activity: Not on file  Other Topics Concern   Not on file  Social History Narrative   Not on file     History reviewed. No pertinent family history.   Current Outpatient Medications on File Prior to Visit  Medication Sig Dispense Refill  diclofenac (VOLTAREN) 75 MG EC tablet Take 1 tablet (75 mg total) by mouth 2 (two) times daily. 60 tablet 1   Multiple Vitamins-Minerals (EMERGEN-C IMMUNE PLUS/VIT D) CHEW Chew by mouth.     No current facility-administered medications on file prior to visit.     Cardiovascular studies:  EKG 01/09/2019: Sinus rhythm 77 bpm. Poor R wave progression. Nonspecific ST-T changes, inferior leads.    Recent labs: 11/22/2018: Glucose 81.  BUN/creatinine/0.77.  eGFR normal.  Sodium 141, potassium 4.4.  Rest of the CMP normal. H/H 12/36.  MCV 93.  Platelets 349. Cholesterol 250, triglycerides 268, HDL 55, LDL 114. Hemoglobin A1c 5.4%.   Review of Systems  Constitution: Negative for decreased appetite, malaise/fatigue, weight gain and weight loss.  HENT: Negative for congestion.   Eyes: Negative for visual disturbance.    Cardiovascular: Negative for chest pain, dyspnea on exertion, leg swelling, palpitations and syncope.  Respiratory: Positive for snoring. Negative for cough.   Endocrine: Negative for cold intolerance.  Hematologic/Lymphatic: Does not bruise/bleed easily.  Skin: Negative for itching and rash.  Musculoskeletal: Negative for myalgias.  Gastrointestinal: Negative for abdominal pain, nausea and vomiting.  Genitourinary: Negative for dysuria.  Neurological: Negative for dizziness and weakness.  Psychiatric/Behavioral: The patient is not nervous/anxious.   All other systems reviewed and are negative.        Vitals:   01/09/19 0941 01/09/19 1008  BP: (!) 158/104 136/87  Pulse: 74 68  SpO2: 98%      Body mass index is 31.93 kg/m. Filed Weights   01/09/19 0941  Weight: 197 lb 12.8 oz (89.7 kg)     Objective:   Physical Exam  Constitutional: She is oriented to person, place, and time. She appears well-developed and well-nourished. No distress.  HENT:  Head: Normocephalic and atraumatic.  Eyes: Pupils are equal, round, and reactive to light. Conjunctivae are normal.  Neck: No JVD present.  Cardiovascular: Normal rate, regular rhythm and intact distal pulses.  No murmur heard. Pulmonary/Chest: Effort normal and breath sounds normal. She has no wheezes. She has no rales.  Abdominal: Soft. Bowel sounds are normal. There is no rebound.  Musculoskeletal:        General: No edema.  Lymphadenopathy:    She has no cervical adenopathy.  Neurological: She is alert and oriented to person, place, and time. No cranial nerve deficit.  Skin: Skin is warm and dry.  Psychiatric: She has a normal mood and affect.  Nursing note and vitals reviewed.         Assessment & Recommendations:   33 y.o. African-American female referred for evaluation and management of dyslipidemia.  Dyslipidemia: Mildly elevated LDL and triglycerides.  Reasonable to try diet and lifestyle changes at this  time.  Recommend repeating lipid panel, fasting, in 3 months.  If continues to have elevated triglyceride, will consider medical therapy.  Diet and lifestyle recommendations, detailed below.  Elevated blood pressure without diagnosis of hypertension: Again, discussed diet and lifestyle changes.  No medication started today.  Given her snoring, referred for evaluation of obstructive sleep apnea.  Tobacco cessation counseling:  - Currently smoking 1 cigar/day   - Patient was informed of the dangers of tobacco abuse including stroke, cancer, and MI, as well as benefits of tobacco cessation. - Patient is willing to quit at this time. - Approximately 5 mins were spent counseling patient cessation techniques. We discussed various methods to help quit smoking, including deciding on a date to quit, joining a support group,  pharmacological agents. Patient would like to use nicotine gum. - I will reassess her progress at the next follow-up visit  I discussed with the patient regarding avoidance of pregnancy via nicotine gum, or any medications for hyperlipidemia, which could be started in future.  Patient understands.   Physical activity recommendation (The Physical Activity Guidelines for Americans. JAMA 2018;Nov 12) At least 150-300 minutes a week of moderate-intensity, or 75-150 minutes a week of vigorous-intensity aerobic physical activity, or an equivalent combination of moderate- and vigorous-intensity aerobic activity. Adults should perform muscle-strengthening activities on 2 or more days a week. Older adults should do multicomponent physical activity that includes balance training as well as aerobic and muscle-strengthening activities. Benefits of increased physical activity include lower risk of mortality including cardiovascular mortality, lower risk of cardiovascular events and associated risk factors (hypertension and diabetes), and lower risk of many cancers (including bladder, breast, colon,  endometrium, esophagus, kidney, lung, and stomach). Additional improvments have been seen in cognition, risk of dementia, anxiety and depression, improved bone health, lower risk of falls, and associated injuries.  Dietary recommendation The 2019 ACC/AHA guidelines promote nutrition as a main fixture of cardiovascular wellness, with a recommendation for a varied diet of fruit, vegetables, fish, legumes, and whole grains (Class I), as well as recommendations to reduce sodium, cholesterol, processed meats, and refined sugars (Class IIa recommendation).10 Sodium intake, a topic of some controversy as of late, is recommended to be kept at 1,500 mg/day or less, far below the average daily intake in the [place] of 3,409 mg/day, and notably below that of previous [place] recommendations for <2,329m/day.10,11 For those unable to reach 1,500 mg/day, they recommend at least a reduction of 1000 mg/day.  A Pesco-Mediterranean Diet With Intermittent Fasting: JACC Review Topic of the Week. J Am Coll Cardiol 25397;67:3419-3790Pesco-Mediterranean diet, it is supplemented with extra-virgin olive oil (EVOO), which is the principle fat source, along with moderate amounts of dairy (particularly yogurt and cheese) and eggs, as well as modest amounts of alcohol consumption (ideally red wine with the evening meal), but few red and processed meats.   Thank you for referring the patient to [facility] Please feel free to contact with any questions.  MNigel Mormon MD [facility] Pager: [phone]: [phone] no answer Cell [phone]

## 2019-01-09 ENCOUNTER — Encounter: Payer: Self-pay | Admitting: Cardiology

## 2019-01-09 ENCOUNTER — Ambulatory Visit (INDEPENDENT_AMBULATORY_CARE_PROVIDER_SITE_OTHER): Payer: 59 | Admitting: Cardiology

## 2019-01-09 ENCOUNTER — Other Ambulatory Visit: Payer: Self-pay

## 2019-01-09 VITALS — BP 136/87 | HR 68 | Ht 66.0 in | Wt 197.8 lb

## 2019-01-09 DIAGNOSIS — R0683 Snoring: Secondary | ICD-10-CM | POA: Diagnosis not present

## 2019-01-09 DIAGNOSIS — R030 Elevated blood-pressure reading, without diagnosis of hypertension: Secondary | ICD-10-CM | POA: Diagnosis not present

## 2019-01-09 DIAGNOSIS — E782 Mixed hyperlipidemia: Secondary | ICD-10-CM | POA: Diagnosis not present

## 2019-01-09 DIAGNOSIS — Z720 Tobacco use: Secondary | ICD-10-CM | POA: Diagnosis not present

## 2019-01-09 MED ORDER — NICOTINE POLACRILEX 2 MG MT GUM: 2.0000 mg | CHEWING_GUM | OROMUCOSAL | 2 refills | Status: AC | PRN

## 2019-01-09 NOTE — Patient Instructions (Signed)
Physical activity recommendation (The Physical Activity Guidelines for Americans. JAMA 2018;Nov 12) At least 150-300 minutes a week of moderate-intensity, or 75-150 minutes a week of vigorous-intensity aerobic physical activity, or an equivalent combination of moderate- and vigorous-intensity aerobic activity. Adults should perform muscle-strengthening activities on 2 or more days a week. Older adults should do multicomponent physical activity that includes balance training as well as aerobic and muscle-strengthening activities. Benefits of increased physical activity include lower risk of mortality including cardiovascular mortality, lower risk of cardiovascular events and associated risk factors (hypertension and diabetes), and lower risk of many cancers (including bladder, breast, colon, endometrium, esophagus, kidney, lung, and stomach). Additional improvments have been seen in cognition, risk of dementia, anxiety and depression, improved bone health, lower risk of falls, and associated injuries.  Dietary recommendation The 2019 ACC/AHA guidelines promote nutrition as a main fixture of cardiovascular wellness, with a recommendation for a varied diet of fruit, vegetables, fish, legumes, and whole grains (Class I), as well as recommendations to reduce sodium, cholesterol, processed meats, and refined sugars (Class IIa recommendation).10 Sodium intake, a topic of some controversy as of late, is recommended to be kept at 1,500 mg/day or less, far below the average daily intake in the [place] of 3,409 mg/day, and notably below that of previous US recommendations for <2,300mg/day.10,11 For those unable to reach 1,500 mg/day, they recommend at least a reduction of 1000 mg/day.  A Pesco-Mediterranean Diet With Intermittent Fasting: JACC Review Topic of the Week. J Am Coll Cardiol 2020;76:1484-1493 Pesco-Mediterranean diet, it is supplemented with extra-virgin olive oil (EVOO), which is the principle fat source, along  with moderate amounts of dairy (particularly yogurt and cheese) and eggs, as well as modest amounts of alcohol consumption (ideally red wine with the evening meal), but few red and processed meats.  

## 2019-01-16 ENCOUNTER — Other Ambulatory Visit: Payer: Self-pay

## 2019-01-16 ENCOUNTER — Encounter: Payer: Self-pay | Admitting: Neurology

## 2019-01-16 ENCOUNTER — Ambulatory Visit (INDEPENDENT_AMBULATORY_CARE_PROVIDER_SITE_OTHER): Payer: 59 | Admitting: Neurology

## 2019-01-16 VITALS — BP 117/79 | HR 74 | Ht 66.0 in | Wt 202.0 lb

## 2019-01-16 DIAGNOSIS — R0683 Snoring: Secondary | ICD-10-CM | POA: Diagnosis not present

## 2019-01-16 DIAGNOSIS — E669 Obesity, unspecified: Secondary | ICD-10-CM

## 2019-01-16 DIAGNOSIS — R351 Nocturia: Secondary | ICD-10-CM | POA: Diagnosis not present

## 2019-01-16 DIAGNOSIS — R519 Headache, unspecified: Secondary | ICD-10-CM | POA: Diagnosis not present

## 2019-01-16 NOTE — Progress Notes (Signed)
Subjective:    Patient ID: Samantha Haney is a 33 y.o. female.  HPI     Samantha Age, Samantha Haney, Samantha Haney [facility] [address] [place],  [address]  Dear Dr. Virgina Jock, I saw your patient, Samantha Haney, upon your kind request to my sleep clinic today for initial consultation of her sleep disorder, in particular, concern for underlying obstructive sleep apnea.  The patient is unaccompanied today.  As you know, Samantha Haney is a 33 year old right-handed woman with an underlying medical history of hyperlipidemia, elevated blood pressure without a formal diagnosis of hypertension and obesity, who reports snoring and sleep disruption, difficulty initiating and maintaining sleep.  I reviewed your office note from 01/09/2019.  She has been counseled regarding her cigar smoking and encouraged to quit smoking altogether.  Her Epworth sleepiness score is 5 out of 24, fatigue severity score is 27 out of 63. She has trouble going to sleep and staying asleep.  She admits that sometimes she drinks wine to help her sleep.  She now has reduced it to only 2 or 3 times a week.  She typically drinks 2 glasses of wine.  She snores, sometimes loudly.  She lives with her mom and teenage daughters.  She has not woken up with a gasping sensation but has had slight morning headaches occasionally.  She has nocturia about 2-3 times per average night.  Sometimes she feels itchy at night.  She typically goes to bed between 11 PM and 1 AM, rise time is around 9 AM.  She works from home for [employer].  She is a restless sleeper and tosses and turns.  She does not have any telltale symptoms of restless leg syndrome.  She does like to sleep with a pillow in between her legs.  She has a nephew with sleep apnea.   Her Past Medical History Is Significant For: Past Medical History:  Diagnosis Date  . Hyperlipidemia     Her Past Surgical History Is Significant For: Past  Surgical History:  Procedure Laterality Date  . FOOT FRACTURE SURGERY Bilateral     Her Family History Is Significant For: History reviewed. No pertinent family history.  Her Social History Is Significant For: Social History   Socioeconomic History  . Marital status: Single    Spouse name: Not on file  . Number of children: 2  . Years of education: Not on file  . Highest education level: Not on file  Occupational History  . Not on file  Social Needs  . Financial resource strain: Not on file  . Food insecurity    Worry: Not on file    Inability: Not on file  . Transportation needs    Medical: Not on file    Non-medical: Not on file  Tobacco Use  . Smoking status: Current Some Day Smoker    Types: Cigars  . Smokeless tobacco: Never Used  . Tobacco comment: black and milds 1 per day  Substance and Sexual Activity  . Alcohol use: Yes    Comment: at night to help sleep  . Drug use: Never  . Sexual activity: Yes    Birth control/protection: Condom  Lifestyle  . Physical activity    Days per week: Not on file    Minutes per session: Not on file  . Stress: Not on file  Relationships  . Social Herbalist on phone: Not on file    Gets together: Not on file  Attends religious service: Not on file    Active member of club or organization: Not on file    Attends meetings of clubs or organizations: Not on file    Relationship status: Not on file  Other Topics Concern  . Not on file  Social History Narrative  . Not on file    Her Allergies Are:  No Known Allergies:   Her Current Medications Are:  Outpatient Encounter Medications as of 01/16/2019  Medication Sig  . diclofenac (VOLTAREN) 75 MG EC tablet Take 1 tablet (75 mg total) by mouth 2 (two) times daily.  . Multiple Vitamins-Minerals (EMERGEN-C IMMUNE PLUS/VIT D) CHEW Chew by mouth.  . nicotine polacrilex (NICORETTE) 2 MG gum Take 1 each (2 mg total) by mouth as needed for smoking cessation.   No  facility-administered encounter medications on file as of 01/16/2019.   :  Review of Systems:  Out of a complete 14 point review of systems, all are reviewed and negative with the exception of these symptoms as listed below:   Review of Systems  Neurological:       Pt presents today to discuss her sleep. Pt has never had a sleep study but does endorse snoring.  Epworth Sleepiness Scale 0= would never doze 1= slight chance of dozing 2= moderate chance of dozing 3= high chance of dozing  Sitting and reading: 2 Watching TV: 2 Sitting inactive in a public place (ex. Theater or meeting): 0 As a passenger in a car for an hour without a break: 0 Lying down to rest in the afternoon: 1 Sitting and talking to someone: 0 Sitting quietly after lunch (no alcohol): 0 In a car, while stopped in traffic: 0 Total: 5     Objective:  Neurological Exam  Physical Exam Physical Examination:   Vitals:   01/16/19 1536  BP: 117/79  Pulse: 74    General Examination: The patient is a very pleasant 33 y.o. female in no acute distress. She appears well-developed and well-nourished and well groomed.   HEENT: Normocephalic, atraumatic, pupils are equal, round and reactive to light, extraocular tracking is good without limitation to gaze excursion or nystagmus noted. Hearing is grossly intact. Face is symmetric with normal facial animation. Speech is clear with no dysarthria noted. There is no hypophonia. There is no lip, neck/head, jaw or voice tremor. Neck is supple with full range of passive and active motion. There are no carotid bruits on auscultation. Oropharynx exam reveals: mild mouth dryness, good dental hygiene and mild airway crowding, due to Small airway entry and redundant soft palate, tonsils are not fully visualized, uvula not fully visualized, Mallampati class III, tongue protrudes centrally in palate elevates symmetrically, neck circumference is 15-7/8 inches.  She has no significant  overbite.   Chest: Clear to auscultation without wheezing, rhonchi or crackles noted.  Heart: S1+S2+0, regular and normal without murmurs, rubs or gallops noted.   Abdomen: Soft, non-tender and non-distended with normal bowel sounds appreciated on auscultation.  Extremities: There is no pitting edema in the distal lower extremities bilaterally.   Skin: Warm and dry without trophic changes noted.   Musculoskeletal: exam reveals no obvious joint deformities, tenderness or joint swelling or erythema.   Neurologically:  Mental status: The patient is awake, alert and oriented in all 4 spheres. Her immediate and remote memory, attention, language skills and fund of knowledge are appropriate. There is no evidence of aphasia, agnosia, apraxia or anomia. Speech is clear with normal prosody and enunciation. Thought  process is linear. Mood is normal and affect is normal.  Cranial nerves II - XII are as described above under HEENT exam.  Motor exam: Normal bulk, strength and tone is noted. There is no tremor, Romberg is negative. Fine motor skills and coordination: grossly intact.  Cerebellar testing: No dysmetria or intention tremor. There is no truncal or gait ataxia.  Sensory exam: intact to light touch in the upper and lower extremities.  Gait, station and balance: She stands easily. No veering to one side is noted. No leaning to one side is noted. Posture is Haney-appropriate and stance is narrow based. Gait shows normal stride length and normal pace. No problems turning are noted. Tandem walk is unremarkable.                Assessment and Plan:  In summary, Samantha Haney is a very pleasant 33 y.o.-year old female  with an underlying medical history of hyperlipidemia, elevated blood pressure without a formal diagnosis of hypertension and obesity, whose history and physical exam are concerning for obstructive sleep apnea (OSA). I had a long chat with the patient about my findings and the diagnosis  of OSA, its prognosis and treatment options. We talked about medical treatments, surgical interventions and non-pharmacological approaches. I explained in particular the risks and ramifications of untreated moderate to severe OSA, especially with respect to developing cardiovascular disease down the Road, including congestive heart failure, difficult to treat hypertension, cardiac arrhythmias, or stroke. Even type 2 diabetes has, in part, been linked to untreated OSA. Symptoms of untreated OSA include daytime sleepiness, memory problems, mood irritability and mood disorder such as depression and anxiety, lack of energy, as well as recurrent headaches, especially morning headaches. We talked about cigar smoking cessation and trying to maintain a healthy lifestyle in general, as well as the importance of weight control. We also talked about the importance of good sleep hygiene. I recommended the following at this time: sleep study.   I explained the sleep test procedure to the patient and also outlined possible surgical and non-surgical treatment options of OSA, including the use of a custom-made dental device (which would require a referral to a specialist dentist or oral surgeon), upper airway surgical options, such as traditional UPPP or a novel less invasive surgical option in the form of Inspire hypoglossal nerve stimulation (which would involve a referral to an ENT surgeon). I also explained the CPAP treatment option to the patient, who indicated that she would be willing to try CPAP if the need arises. I explained the importance of being compliant with PAP treatment, not only for insurance purposes but primarily to improve Her symptoms, and for the patient's long term health benefit, including to reduce Her cardiovascular risks. I answered all her questions today and the patient was in agreement. I plan to see her back after the sleep study is completed and encouraged her to call with any interim questions,  concerns, problems or updates.   Thank you very much for allowing me to participate in the care of this nice patient. If I can be of any further assistance to you please do not hesitate to call me at [phone].  Sincerely,   Huston FoleySaima Brookes Craine, Samantha Haney, Samantha Haney

## 2019-01-16 NOTE — Patient Instructions (Signed)

## 2019-01-30 ENCOUNTER — Ambulatory Visit
Admission: RE | Admit: 2019-01-30 | Discharge: 2019-01-30 | Disposition: A | Discharge status: Home / Self Care | Payer: 59 | Source: Ambulatory Visit | Attending: Family Medicine | Admitting: Family Medicine

## 2019-01-30 ENCOUNTER — Other Ambulatory Visit: Payer: Self-pay

## 2019-01-30 DIAGNOSIS — M545 Low back pain: Secondary | ICD-10-CM

## 2019-01-30 DIAGNOSIS — M5459 Other low back pain: Secondary | ICD-10-CM

## 2019-02-05 ENCOUNTER — Other Ambulatory Visit: Payer: Self-pay

## 2019-02-05 ENCOUNTER — Encounter (HOSPITAL_COMMUNITY): Payer: Self-pay | Admitting: Psychiatry

## 2019-02-05 ENCOUNTER — Ambulatory Visit (INDEPENDENT_AMBULATORY_CARE_PROVIDER_SITE_OTHER): Payer: 59 | Admitting: Psychiatry

## 2019-02-05 VITALS — Wt 202.0 lb

## 2019-02-05 DIAGNOSIS — F1010 Alcohol abuse, uncomplicated: Secondary | ICD-10-CM | POA: Diagnosis not present

## 2019-02-05 DIAGNOSIS — F419 Anxiety disorder, unspecified: Secondary | ICD-10-CM

## 2019-02-05 MED ORDER — BUPROPION HCL ER (XL) 150 MG PO TB24: 150.0000 mg | ORAL_TABLET | Freq: Every day | ORAL | 1 refills | Status: DC
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
## 2019-03-12 ENCOUNTER — Ambulatory Visit (HOSPITAL_COMMUNITY): Payer: 59 | Admitting: Psychiatry

## 2019-03-12 ENCOUNTER — Other Ambulatory Visit: Payer: Self-pay

## 2019-04-07 ENCOUNTER — Other Ambulatory Visit (HOSPITAL_COMMUNITY): Payer: Self-pay | Admitting: *Deleted

## 2019-04-07 DIAGNOSIS — F419 Anxiety disorder, unspecified: Secondary | ICD-10-CM

## 2019-04-07 MED ORDER — BUPROPION HCL ER (XL) 150 MG PO TB24: 150.0000 mg | ORAL_TABLET | Freq: Every day | ORAL | 0 refills | Status: AC

## 2019-04-11 ENCOUNTER — Ambulatory Visit: Payer: 59 | Admitting: Cardiology

## 2019-08-29 ENCOUNTER — Ambulatory Visit (HOSPITAL_COMMUNITY)
Admission: EM | Admit: 2019-08-29 | Discharge: 2019-08-29 | Disposition: A | Discharge status: Home / Self Care | Payer: 59 | Attending: Urgent Care | Admitting: Urgent Care

## 2019-08-29 ENCOUNTER — Encounter (HOSPITAL_COMMUNITY): Payer: Self-pay

## 2019-08-29 ENCOUNTER — Other Ambulatory Visit: Payer: Self-pay

## 2019-08-29 DIAGNOSIS — M546 Pain in thoracic spine: Secondary | ICD-10-CM | POA: Diagnosis not present

## 2019-08-29 DIAGNOSIS — S29012A Strain of muscle and tendon of back wall of thorax, initial encounter: Secondary | ICD-10-CM

## 2019-08-29 DIAGNOSIS — M545 Low back pain, unspecified: Secondary | ICD-10-CM

## 2019-08-29 DIAGNOSIS — S39012A Strain of muscle, fascia and tendon of lower back, initial encounter: Secondary | ICD-10-CM

## 2019-08-29 MED ORDER — NAPROXEN 500 MG PO TABS: 500.0000 mg | ORAL_TABLET | Freq: Two times a day (BID) | ORAL | 0 refills | Status: AC

## 2019-08-29 MED ORDER — TIZANIDINE HCL 4 MG PO TABS: 4.0000 mg | ORAL_TABLET | Freq: Three times a day (TID) | ORAL | 0 refills | Status: AC | PRN
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
